# Patient Record
Sex: Female | Born: 2005 | Race: White | Hispanic: No | Marital: Single | Smoking: Never smoker
Health system: Southern US, Community
[De-identification: ages and names within clinical notes are randomized; demographics above are authoritative.]

## PROBLEM LIST (undated history)

## (undated) ENCOUNTER — Emergency Department (HOSPITAL_BASED_OUTPATIENT_CLINIC_OR_DEPARTMENT_OTHER): Admission: EM | Discharge status: Absent without leave | Payer: Medicaid Other

## (undated) DIAGNOSIS — N83209 Unspecified ovarian cyst, unspecified side: Secondary | ICD-10-CM

## (undated) DIAGNOSIS — Z820 Family history of epilepsy and other diseases of the nervous system: Secondary | ICD-10-CM

## (undated) DIAGNOSIS — D492 Neoplasm of unspecified behavior of bone, soft tissue, and skin: Secondary | ICD-10-CM

## (undated) DIAGNOSIS — N200 Calculus of kidney: Secondary | ICD-10-CM

## (undated) DIAGNOSIS — Z87898 Personal history of other specified conditions: Secondary | ICD-10-CM

## (undated) DIAGNOSIS — M6740 Ganglion, unspecified site: Secondary | ICD-10-CM

## (undated) DIAGNOSIS — Z8669 Personal history of other diseases of the nervous system and sense organs: Secondary | ICD-10-CM

## (undated) HISTORY — DX: Personal history of other diseases of the nervous system and sense organs: Z86.69

## (undated) HISTORY — DX: Family history of epilepsy and other diseases of the nervous system: Z82.0

---
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
## 2023-12-18 ENCOUNTER — Emergency Department (HOSPITAL_BASED_OUTPATIENT_CLINIC_OR_DEPARTMENT_OTHER)

## 2023-12-18 ENCOUNTER — Emergency Department (HOSPITAL_BASED_OUTPATIENT_CLINIC_OR_DEPARTMENT_OTHER)
Admission: EM | Admit: 2023-12-18 | Discharge: 2023-12-18 | Disposition: A | Discharge status: Home / Self Care | Attending: Emergency Medicine | Admitting: Emergency Medicine

## 2023-12-18 ENCOUNTER — Encounter (HOSPITAL_BASED_OUTPATIENT_CLINIC_OR_DEPARTMENT_OTHER): Payer: Self-pay | Admitting: Emergency Medicine

## 2023-12-18 ENCOUNTER — Other Ambulatory Visit: Payer: Self-pay

## 2023-12-18 DIAGNOSIS — G43909 Migraine, unspecified, not intractable, without status migrainosus: Secondary | ICD-10-CM | POA: Insufficient documentation

## 2023-12-18 DIAGNOSIS — R519 Headache, unspecified: Secondary | ICD-10-CM | POA: Diagnosis present

## 2023-12-18 DIAGNOSIS — G43109 Migraine with aura, not intractable, without status migrainosus: Secondary | ICD-10-CM

## 2023-12-18 HISTORY — DX: Unspecified ovarian cyst, unspecified side: N83.209

## 2023-12-18 HISTORY — DX: Ganglion, unspecified site: M67.40

## 2023-12-18 LAB — CBC WITH DIFFERENTIAL/PLATELET
Abs Immature Granulocytes: 0.03 K/uL (ref 0.00–0.07)
Basophils Absolute: 0 K/uL (ref 0.0–0.1)
Basophils Relative: 1 %
Eosinophils Absolute: 0.1 K/uL (ref 0.0–0.5)
Eosinophils Relative: 1 %
HCT: 38.8 % (ref 36.0–46.0)
Hemoglobin: 13.2 g/dL (ref 12.0–15.0)
Immature Granulocytes: 0 %
Lymphocytes Relative: 21 %
Lymphs Abs: 1.8 K/uL (ref 0.7–4.0)
MCH: 29.5 pg (ref 26.0–34.0)
MCHC: 34 g/dL (ref 30.0–36.0)
MCV: 86.8 fL (ref 80.0–100.0)
Monocytes Absolute: 0.6 K/uL (ref 0.1–1.0)
Monocytes Relative: 7 %
Neutro Abs: 6 K/uL (ref 1.7–7.7)
Neutrophils Relative %: 70 %
Platelets: 319 K/uL (ref 150–400)
RBC: 4.47 MIL/uL (ref 3.87–5.11)
RDW: 11.9 % (ref 11.5–15.5)
WBC: 8.5 K/uL (ref 4.0–10.5)
nRBC: 0 % (ref 0.0–0.2)

## 2023-12-18 LAB — COMPREHENSIVE METABOLIC PANEL WITH GFR
ALT: 6 U/L (ref 0–44)
AST: 16 U/L (ref 15–41)
Albumin: 4.6 g/dL (ref 3.5–5.0)
Alkaline Phosphatase: 69 U/L (ref 38–126)
Anion gap: 13 (ref 5–15)
BUN: 15 mg/dL (ref 6–20)
CO2: 24 mmol/L (ref 22–32)
Calcium: 9.6 mg/dL (ref 8.9–10.3)
Chloride: 103 mmol/L (ref 98–111)
Creatinine, Ser: 0.66 mg/dL (ref 0.44–1.00)
GFR, Estimated: >60 mL/min (ref >60)
Glucose, Bld: 87 mg/dL (ref 70–99)
Potassium: 3.4 mmol/L — ABNORMAL LOW (ref 3.5–5.1)
Sodium: 141 mmol/L (ref 135–145)
Total Bilirubin: 0.9 mg/dL (ref 0.0–1.2)
Total Protein: 7.1 g/dL (ref 6.5–8.1)

## 2023-12-18 LAB — PREGNANCY, URINE: Preg Test, Ur: NEGATIVE

## 2023-12-18 MED ORDER — DEXAMETHASONE SODIUM PHOSPHATE 10 MG/ML IJ SOLN
10.0000 mg | Freq: Once | INTRAMUSCULAR | Status: AC
  Administered 2023-12-18: 10 mg via INTRAVENOUS
  Filled 2023-12-18: qty 1

## 2023-12-18 MED ORDER — METOCLOPRAMIDE HCL 5 MG/ML IJ SOLN
10.0000 mg | Freq: Once | INTRAMUSCULAR | Status: AC
  Administered 2023-12-18: 10 mg via INTRAVENOUS
  Filled 2023-12-18: qty 2

## 2023-12-18 MED ORDER — DIPHENHYDRAMINE HCL 50 MG/ML IJ SOLN
12.5000 mg | Freq: Once | INTRAMUSCULAR | Status: AC
  Administered 2023-12-18: 12.5 mg via INTRAVENOUS
  Filled 2023-12-18: qty 1

## 2023-12-18 MED ORDER — KETOROLAC TROMETHAMINE 15 MG/ML IJ SOLN
15.0000 mg | Freq: Once | INTRAMUSCULAR | Status: AC
  Administered 2023-12-18: 15 mg via INTRAVENOUS
  Filled 2023-12-18: qty 1

## 2023-12-18 MED ORDER — METOCLOPRAMIDE HCL 10 MG PO TABS: 10.0000 mg | ORAL_TABLET | Freq: Four times a day (QID) | ORAL | 0 refills | Status: AC

## 2023-12-18 NOTE — ED Triage Notes (Signed)
 Pt states woke up at 3 am, felt weird feeling in head, laid back down, about 6:30 on the way to work, came back with a migraine and  blurred vision. States migraine is gone but still has slight headache and weird feeling. Describes it as physical not mental and like disassociated. States period started today.

## 2023-12-18 NOTE — ED Provider Notes (Signed)
 [facility] Provider Note   CSN: [id number] Arrival date & time: 12/18/23  1614     Patient presents with: Blurred Vision   Kristen Pope is a 18 y.o. female.   18 year old female with a history of migraines presents emergency department headache and vision changes.  Patient reports that since she was approximately 67 or 10 she has been having complex migraines and typically will have vision changes out of her right eye and numbness on her left side of her body that accompany her headaches.  Says that last night she woke up at 3 AM with a strange feeling in her head that she has difficulty characterizing.  Since then has developed a headache that is 8/10 in severity.  Mostly along the middle of her head and radiating down to her right occiput.  No photo or phonophobia.  Did have transient blurry vision out of her right eye but no numbness that is characteristic of these headaches for her.  Has not yet taken any medications for her headache.       Prior to Admission medications   Medication Sig Start Date End Date Taking? Authorizing Provider  metoCLOPramide  (REGLAN ) 10 MG tablet Take 1 tablet (10 mg total) by mouth every 6 (six) hours. 12/18/23  Yes Yolande Lamar BROCKS, MD  ergocalciferol (VITAMIN D2) 1.25 MG (50000 UT) capsule Take by mouth. 05/10/18   [provider]  ESTARYLLA 0.25-35 MG-MCG tablet Take 1 tablet by mouth daily. 08/09/19   [provider]  fluconazole  (DIFLUCAN ) 150 MG tablet Take 1 tablet (150 mg total) by mouth daily. 01/21/22   Randol Simmonds, MD  fluticasone (FLONASE) 50 MCG/ACT nasal spray SMARTSIG:1 Spray(s) Both Nares Daily PRN 02/13/19   [provider]  HYDROcodone -acetaminophen  (NORCO) 5-325 MG tablet Take 1 tablet by mouth every 4 (four) hours as needed for severe pain. 01/21/22   Randol Simmonds, MD  naproxen  (NAPROSYN ) 375 MG tablet Take 1 tablet (375 mg total) by mouth 2 (two) times daily. 01/21/22    Randol Simmonds, MD  norelgestromin-ethinyl estradiol (ZAFEMY) 150-35 MCG/24HR transdermal patch Place 1 patch onto the skin once a week.    [provider]  Norethindrone Acetate-Ethinyl Estrad-FE (LOESTRIN 24 FE) 1-20 MG-MCG(24) tablet Take by mouth. 07/17/19   [provider]  ondansetron  (ZOFRAN ) 4 MG tablet Take 1 tablet (4 mg total) by mouth every 8 (eight) hours as needed for nausea or vomiting. 08/16/21   Horton, Kristie M, DO  sertraline (ZOLOFT) 25 MG tablet 50 mg. 08/25/19   [provider]  tamsulosin  (FLOMAX ) 0.4 MG CAPS capsule Take 1 capsule (0.4 mg total) by mouth daily. 08/16/21   Horton, Kristie M, DO  VITAMIN D, CHOLECALCIFEROL, PO Take by mouth.    [provider]    Allergies: Amoxicillin-pot clavulanate and Doxycycline    Review of Systems  Updated Vital Signs BP 126/83 (BP Location: Right Arm)   Pulse 79   Temp 99 F (37.2 C) (Oral)   Resp 16   Ht 5' (1.524 m)   Wt 51 kg   LMP 12/18/2023   SpO2 99%   BMI 21.95 kg/m   Physical Exam Vitals and nursing note reviewed.  Constitutional:      General: She is not in acute distress.    Appearance: She is well-developed.  HENT:     Head: Normocephalic and atraumatic.     Comments: No rashes over scalp or neck    Right Ear:  External ear normal.     Left Ear: External ear normal.     Nose: Nose normal.  Eyes:     Extraocular Movements: Extraocular movements intact.     Conjunctiva/sclera: Conjunctivae normal.     Pupils: Pupils are equal, round, and reactive to light.     Comments: Visual Acuity Bilateral Distance: 20/32R Distance: 20/50L Distance: 20/25   Musculoskeletal:     Cervical back: Normal range of motion and neck supple.  Skin:    General: Skin is warm and dry.  Neurological:     Mental Status: She is alert.     Comments: MENTAL STATUS: AAOx3 CRANIAL NERVES: II: Pupils equal and reactive 5 mm BL, no RAPD, no VF deficits III, IV, VI: EOM intact, no gaze preference or  deviation, no nystagmus. V: normal sensation to light touch in V1, V2, and V3 segments bilaterally VII: no facial weakness or asymmetry, no nasolabial fold flattening VIII: normal hearing to speech and finger friction IX, X: normal palatal elevation, no uvular deviation XI: 5/5 head turn and 5/5 shoulder shrug bilaterally XII: midline tongue protrusion MOTOR: 5/5 strength in R shoulder flexion, elbow flexion and extension, and grip strength. 5/5 strength in L shoulder flexion, elbow flexion and extension, and grip strength.  5/5 strength in R hip and knee flexion, knee extension, ankle plantar and dorsiflexion. 5/5 strength in L hip and knee flexion, knee extension, ankle plantar and dorsiflexion. SENSORY: Normal sensation to light touch in all extremities COORD: Normal finger to nose and heel to shin, no tremor, no dysmetria STATION: No truncal ataxia   Psychiatric:        Mood and Affect: Mood normal.     (all labs ordered are listed, but only abnormal results are displayed) Labs Reviewed  COMPREHENSIVE METABOLIC PANEL WITH GFR - Abnormal; Notable for the following components:      Result Value   Potassium 3.4 (*)    All other components within normal limits  CBC WITH DIFFERENTIAL/PLATELET  PREGNANCY, URINE    EKG: None  Radiology: CT Head Wo Contrast Result Date: 12/18/2023 CLINICAL DATA:  Headache and vision changes. EXAM: CT HEAD WITHOUT CONTRAST TECHNIQUE: Contiguous axial images were obtained from the base of the skull through the vertex without intravenous contrast. RADIATION DOSE REDUCTION: This exam was performed according to the departmental dose-optimization program which includes automated exposure control, adjustment of the mA and/or kV according to patient size and/or use of iterative reconstruction technique. COMPARISON:  Head CT 11/25/2009 FINDINGS: Brain: There is no evidence of an acute infarct, intracranial hemorrhage, mass, midline shift, or extra-axial fluid  collection. Cerebral volume is normal. The ventricles are normal in size. Vascular: No hyperdense vessel. Skull: No fracture or suspicious lesion. Sinuses/Orbits: Suspected small mucous retention cyst in the right sphenoid sinus. Clear mastoid air cells. Unremarkable orbits. Other: None. IMPRESSION: Unremarkable CT appearance of the brain. Electronically Signed   By: Dasie Hamburg M.D.   On: 12/18/2023 18:07     Procedures   Medications Ordered in the ED  ketorolac  (TORADOL ) 15 MG/ML injection 15 mg (15 mg Intravenous Given 12/18/23 1723)  metoCLOPramide  (REGLAN ) injection 10 mg (10 mg Intravenous Given 12/18/23 1727)  diphenhydrAMINE  (BENADRYL ) injection 12.5 mg (12.5 mg Intravenous Given 12/18/23 1720)  dexamethasone  (DECADRON ) injection 10 mg (10 mg Intravenous Given 12/18/23 1725)  Medical Decision Making Amount and/or Complexity of Data Reviewed Labs: ordered. Radiology: ordered.  Risk Prescription drug management.   18 year old female with history of migraines who presents emergency department headache and vision changes  Initial Ddx:  Complicated migraine, headache, meningitis, SAH, TIA, stroke, MS  MDM/Course:  Patient presents to the emergency department with a headache and vision changes.  Says that she often times will have vision changes but typically has numbness of her arm which is absent this time.  Headache appears to be consistent with her history of complicated migraines though she has not been formally diagnosed with this yet.  Does not have any focal neurologic deficits at this point in time.  Visual acuity appears to be grossly normal.  She had a head CT that does not show acute findings.  Was given a migraine cocktail and upon re-evaluation her headache was only 1/10 in severity.  Given her age and risk factors feel today and stroke are less likely.  Given the recurrent headaches that she is having with the neurologic deficits do feel  like she would benefit from neurology evaluation though.  Placed referral to outpatient neurology and instructed also follow-up with her primary doctor in several days as well.  This patient presents to the ED for concern of complaints listed in HPI, this involves an extensive number of treatment options, and is a complaint that carries with it a high risk of complications and morbidity. Disposition including potential need for admission considered.   Dispo: DC Home. Return precautions discussed including, but not limited to, those listed in the AVS. Allowed pt time to ask questions which were answered fully prior to dc.  Additional history obtained from mother Records reviewed Outpatient Clinic Notes The following labs were independently interpreted: Chemistry and show no acute abnormality I independently reviewed the following imaging with scope of interpretation limited to determining acute life threatening conditions related to emergency care: CT Head and agree with the radiologist interpretation with the following exceptions: none I personally reviewed and interpreted cardiac monitoring: normal sinus rhythm  I personally reviewed and interpreted the pt's EKG: see above for interpretation  I have reviewed the patients home medications and made adjustments as needed  Portions of this note were generated with Dragon dictation software. Dictation errors may occur despite best attempts at proofreading.     Final diagnoses:  Complicated migraine    ED Discharge Orders          Ordered    metoCLOPramide  (REGLAN ) 10 MG tablet  Every 6 hours        12/18/23 1817    Ambulatory referral to Neurology       Comments: An appointment is requested in approximately: 2 weeks   12/18/23 1818               Yolande Lamar BROCKS, MD 12/18/23 2003

## 2023-12-18 NOTE — Discharge Instructions (Signed)
 You were seen for your headache in the emergency department.  You were given medicines which improved your symptoms.  At home, please take Tylenol  and ibuprofen  for your headache.  You may also take the Reglan  we have prescribed you for your headache or any nausea or vomiting that you have.  You may take this with Benadryl  for severe headaches but please note that this will make you drowsy.    Check your MyChart online for the results of any tests that had not resulted by the time you left the emergency department.   Follow-up with your primary doctor in 2-3 days regarding your visit.  Neurology will call you about an appointment.  If you do not hear from them in the next 2-3 business days please give them a call using this information.  Return immediately to the emergency department if you experience any of the following: Vision changes, numbness or weakness of your arms or legs, or any other concerning symptoms.    Thank you for visiting our Emergency Department. It was a pleasure taking care of you today.

## 2023-12-20 ENCOUNTER — Encounter: Payer: Self-pay | Admitting: Neurology

## 2023-12-24 ENCOUNTER — Ambulatory Visit (INDEPENDENT_AMBULATORY_CARE_PROVIDER_SITE_OTHER): Admitting: Neurology

## 2023-12-24 ENCOUNTER — Encounter: Payer: Self-pay | Admitting: Neurology

## 2023-12-24 VITALS — BP 118/79 | HR 92 | Ht 60.0 in | Wt 110.0 lb

## 2023-12-24 DIAGNOSIS — G43109 Migraine with aura, not intractable, without status migrainosus: Secondary | ICD-10-CM | POA: Diagnosis not present

## 2023-12-24 MED ORDER — ONDANSETRON HCL 4 MG PO TABS: 4.0000 mg | ORAL_TABLET | Freq: Three times a day (TID) | ORAL | 5 refills | Status: DC | PRN

## 2023-12-24 NOTE — Patient Instructions (Signed)
  Take Ubrelvy at earliest onset of headache.  May repeat dose once in 2 hours if needed.  Maximum 2 tablets in 24 hours. Zofran  for nausea Limit use of pain relievers to no more than 9 days out of the month.  These medications include acetaminophen , NSAIDs (ibuprofen /Advil /Motrin , naproxen /Aleve , triptans (Imitrex/sumatriptan), Excedrin, and narcotics.  This will help reduce risk of rebound headaches. Be aware of common food triggers Routine exercise Stay adequately hydrated (aim for 64 oz water daily) Keep headache diary Maintain proper stress management Maintain proper sleep hygiene Do not skip meals Consider supplements:  MigreLief twice daily, coenzyme Q10 100mg  three times daily.

## 2023-12-24 NOTE — Progress Notes (Signed)
 NEUROLOGY CONSULTATION NOTE  [name] MRN: [id number] DOB: Apr 28, 2006  Referring provider: Lamar Shan, MD (ED referral) Primary care provider: [facility]  Reason for consult:  migraine  Assessment/Plan:   Migraine with aura, without status migrainosus, not intractable  At this time, I don't think further testing, such as MRI, is warranted.  Headache was consistent with her habitual migraines except it didn't include the unilateral numbness/paresthesias, exam is unremarkable and CT head unremarkable.   Migraine prevention:  At this time, will hold off on starting a preventative medication.  If she begins having increased frequency, we can consider starting a preventative medication.  In the meantime, we talked about lifestyle modification and vitamins/supplements options (magnesium, B2, CoQ10) Migraine rescue:  Provided samples of Ubrelvy 100mg .  Cannot take triptans as she has stroke-like symptoms with her migraines Limit use of pain relievers to no more than 9 days out of the month to prevent risk of rebound or medication-overuse headache. Keep headache diary Advised not to take estrogen-based birth control medication as she has aura Follow up 8 months.    Subjective:  Kristen Pope is a 18 year old right-handed female with history of isolated post-traumatic seizure and kidney stone who presents for migraine.  History supplemented by ED note.  CT head personally reviewed.  Onset:  18 or 18 years old.   Location:  right sided radiating to back of the head (sometimes left sided) Quality:  stabbing Intensity:  9/10.  Aura:  white out of half visual field involving eye on side of headache (usually preceding headache), contralateral hemisensory paresthesias (face, limbs, torso) usually preceding migraine.  Aura lasts 1 hour Prodrome:  dissociated, brain fog Postdrome:  feels fatigued and disassociated for a couple of days Associated  symptoms:  Photophobia, phonophobia.  Rarely nausea..  She denies associated vomiting, osmophobia, autonomic symptoms or associated unilateral weakness Duration:  headache lasts 2-3 hours Frequency:  once every month to every other month Triggers:  strobe lights, flashing arcade lights, fireworks Relieving factors:  ibuprofen , rest in dark and cool room Activity:  aggravates Resolved around age 18 or 18 years 18 years old.  She had her first migraine since age 20-20 on 8/16 on 8/16 which was different.  She believes it was triggered by watching fireworks the previous night.  She woke up at 3 AM and felt weird in her head and then later that day developed an 8/10 headache While she did have transient blurred vision in her right eye, this time she didn't experience the left sided numbness.  She went to the ED.  CT head was unremarkable and she was treated with migraine cocktail.  Headache lasted about 2 hours.  For the past 2 days she has had a persistent dull 4/10 pressure headache from the mid-frontal region radiating to the right side but no associated symptoms but it appears to be improving.  Past NSAIDS/analgesics:  naproxen , Toradol  IV Past abortive triptans:  none Past abortive ergotamine:  none Past muscle relaxants:  none Past anti-emetic:  none Past antihypertensive medications:  none Past antidepressant medications:  none Past anticonvulsant medications:  none Past anti-CGRP:  none Past vitamins/Herbal/Supplements:  none Past antihistamines/decongestants:  Claritin, Benadryl  Other past therapies:  none  Current NSAIDS/analgesics:  ibuprofen  Current triptans:  none Current ergotamine:  none Current anti-emetic:  Zofran  4mg  Current muscle relaxants:  none Current Antihypertensive medications:  none Current Antidepressant medications:  sertraline 50mg  daily Current Anticonvulsant medications:  none Current anti-CGRP:  none  Current Vitamins/Herbal/Supplements:  none Current  Antihistamines/Decongestants:  none Other therapy:  none Birth control:  none    Caffeine:  1 cup coffee every other day.  No colas Alcohol:  rarely Smoker:  nicotine vape Diet:  Sprite.   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  Gets 7-8 hours of sleep a night.  Feels rested.   History of TBI/concussion:  She did have a provoked seizure as a child in 2011 when she tripped and fell face first on the ground.  She had a CT head on 11/25/2009 which was unremarkable.  Unsure if she had an EEG.  No recurrence Family history of headache:  mom.  No family history of seizures. 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     ESTARYLLA 0.25-35 MG-MCG tablet Take 1 tablet by mouth daily.     fluconazole  (DIFLUCAN ) 150 MG tablet Take 1 tablet (150 mg total) by mouth daily. 1 tablet 0   fluticasone (FLONASE) 50 MCG/ACT nasal spray SMARTSIG:1 Spray(s) Both Nares Daily PRN     HYDROcodone -acetaminophen  (NORCO) 5-325 MG tablet Take 1 tablet by mouth every 4 (four) hours as needed for severe pain. 15 tablet 0   metoCLOPramide   (REGLAN ) 10 MG tablet Take 1 tablet (10 mg total) by mouth every 6 (six) hours. 15 tablet 0   naproxen  (NAPROSYN ) 375 MG tablet Take 1 tablet (375 mg total) by mouth 2 (two) times daily. 20 tablet 0   norelgestromin-ethinyl estradiol (ZAFEMY) 150-35 MCG/24HR transdermal patch Place 1 patch onto the skin once a week.     Norethindrone Acetate-Ethinyl Estrad-FE (LOESTRIN 24 FE) 1-20 MG-MCG(24) tablet Take by mouth.     ondansetron  (ZOFRAN ) 4 MG tablet Take 1 tablet (4 mg total) by mouth every 8 (eight) hours as needed for nausea or vomiting. 4 tablet 0   sertraline (ZOLOFT) 25 MG tablet 50 mg.     tamsulosin  (FLOMAX ) 0.4 MG CAPS capsule Take 1 capsule (0.4 mg total) by mouth daily. 15 capsule 0   VITAMIN D, CHOLECALCIFEROL, PO Take by mouth.     No current facility-administered medications on file prior to visit.    ALLERGIES: Allergies  Allergen Reactions   Amoxicillin-Pot Clavulanate Swelling    Lips swelling - mother states she can take amoxicillin without issue   Doxycycline Swelling    FAMILY HISTORY: Family History  Problem Relation Age of Onset   Asthma Brother    Diabetes Maternal Grandmother    Hypertension Maternal Grandmother  Anesthesia problems Maternal Grandmother        post-op N/V; SOB   Diabetes Maternal Grandfather     Objective:  Blood pressure 118/79, pulse 92, height 5' (1.524 m), weight 110 lb (49.9 kg), last menstrual period 12/18/2023, SpO2 97%. General: No acute distress.  Patient appears well-groomed.   Head:  Normocephalic/atraumatic Eyes:  fundi examined but not visualized Neck: supple, no paraspinal tenderness, full range of motion Heart: regular rate and rhythm Neurological Exam: Mental status: alert and oriented to person, place, and time, speech fluent and not dysarthric, language intact. Cranial nerves: CN I: not tested CN II: pupils equal, round and reactive to light, visual fields intact CN III, IV, VI:  full range of motion, no nystagmus,  no ptosis CN V: facial sensation intact. CN VII: upper and lower face symmetric CN VIII: hearing intact CN IX, X: gag intact, uvula midline CN XI: sternocleidomastoid and trapezius muscles intact CN XII: tongue midline Bulk & Tone: normal, no fasciculations. Motor:  muscle strength 5/5 throughout Sensation:  Pinprick and vibratory sensation intact. Deep Tendon Reflexes:  2+ throughout,  toes downgoing.   Finger to nose testing:  Without dysmetria.   Gait:  Normal station and stride.  Romberg negative.    Thank you for allowing me to take part in the care of this patient.  Juliene Dunnings, DO  CC: [facility]

## 2023-12-24 NOTE — Progress Notes (Signed)
 Medication Samples have been provided to the patient.  Drug name: Holland       Strength: 100 mg        Qty: 3  LOT: [id number]  Exp.Date: 12/26  Dosing instructions: as needed  The patient has been instructed regarding the correct time, dose, and frequency of taking this medication, including desired effects and most common side effects.   Kristen Pope 9:56 AM 12/24/2023

## 2024-04-03 ENCOUNTER — Ambulatory Visit: Admitting: Neurology

## 2024-05-21 ENCOUNTER — Encounter (HOSPITAL_BASED_OUTPATIENT_CLINIC_OR_DEPARTMENT_OTHER): Payer: Self-pay

## 2024-05-21 ENCOUNTER — Emergency Department (HOSPITAL_BASED_OUTPATIENT_CLINIC_OR_DEPARTMENT_OTHER)
Admission: EM | Admit: 2024-05-21 | Discharge: 2024-05-21 | Disposition: A | Discharge status: Home / Self Care | Attending: Emergency Medicine | Admitting: Emergency Medicine

## 2024-05-21 DIAGNOSIS — Z3A09 9 weeks gestation of pregnancy: Secondary | ICD-10-CM | POA: Insufficient documentation

## 2024-05-21 DIAGNOSIS — O219 Vomiting of pregnancy, unspecified: Secondary | ICD-10-CM | POA: Insufficient documentation

## 2024-05-21 LAB — CBC WITH DIFFERENTIAL/PLATELET
Abs Immature Granulocytes: 0.07 K/uL (ref 0.00–0.07)
Basophils Absolute: 0 K/uL (ref 0.0–0.1)
Basophils Relative: 0 %
Eosinophils Absolute: 0 K/uL (ref 0.0–0.5)
Eosinophils Relative: 0 %
HCT: 42.1 % (ref 36.0–46.0)
Hemoglobin: 14.4 g/dL (ref 12.0–15.0)
Immature Granulocytes: 0 %
Lymphocytes Relative: 2 %
Lymphs Abs: 0.3 K/uL — ABNORMAL LOW (ref 0.7–4.0)
MCH: 29.6 pg (ref 26.0–34.0)
MCHC: 34.2 g/dL (ref 30.0–36.0)
MCV: 86.4 fL (ref 80.0–100.0)
Monocytes Absolute: 0.7 K/uL (ref 0.1–1.0)
Monocytes Relative: 4 %
Neutro Abs: 15.6 K/uL — ABNORMAL HIGH (ref 1.7–7.7)
Neutrophils Relative %: 94 %
Platelets: 277 K/uL (ref 150–400)
RBC: 4.87 MIL/uL (ref 3.87–5.11)
RDW: 12.5 % (ref 11.5–15.5)
WBC: 16.8 K/uL — ABNORMAL HIGH (ref 4.0–10.5)
nRBC: 0 % (ref 0.0–0.2)

## 2024-05-21 LAB — URINALYSIS, ROUTINE W REFLEX MICROSCOPIC
Bilirubin Urine: NEGATIVE
Glucose, UA: NEGATIVE mg/dL
Hgb urine dipstick: NEGATIVE
Ketones, ur: 40 mg/dL — AB
Leukocytes,Ua: NEGATIVE
Nitrite: NEGATIVE
Protein, ur: NEGATIVE mg/dL
Specific Gravity, Urine: 1.025 (ref 1.005–1.030)
pH: 6.5 (ref 5.0–8.0)

## 2024-05-21 LAB — BASIC METABOLIC PANEL WITH GFR
Anion gap: 14 (ref 5–15)
BUN: 11 mg/dL (ref 6–20)
CO2: 22 mmol/L (ref 22–32)
Calcium: 9.5 mg/dL (ref 8.9–10.3)
Chloride: 101 mmol/L (ref 98–111)
Creatinine, Ser: 0.51 mg/dL (ref 0.44–1.00)
GFR, Estimated: >60 mL/min
Glucose, Bld: 115 mg/dL — ABNORMAL HIGH (ref 70–99)
Potassium: 4.1 mmol/L (ref 3.5–5.1)
Sodium: 136 mmol/L (ref 135–145)

## 2024-05-21 MED ORDER — SODIUM CHLORIDE 0.9 % IV BOLUS
1000.0000 mL | Freq: Once | INTRAVENOUS | Status: AC
  Administered 2024-05-21: 1000 mL via INTRAVENOUS

## 2024-05-21 MED ORDER — ONDANSETRON HCL 4 MG/2ML IJ SOLN
4.0000 mg | Freq: Once | INTRAMUSCULAR | Status: AC
  Administered 2024-05-21: 4 mg via INTRAVENOUS
  Filled 2024-05-21: qty 2

## 2024-05-21 MED ORDER — METOCLOPRAMIDE HCL 5 MG/ML IJ SOLN
10.0000 mg | Freq: Once | INTRAMUSCULAR | Status: DC
  Filled 2024-05-21: qty 2

## 2024-05-21 MED ORDER — ONDANSETRON HCL 4 MG PO TABS: 4.0000 mg | ORAL_TABLET | Freq: Four times a day (QID) | ORAL | 0 refills | Status: AC

## 2024-05-21 NOTE — ED Provider Notes (Signed)
 " [facility] Provider Note   CSN: [id number] Arrival date & time: 05/21/24  1100     Patient presents with: Emesis   Kristen Pope is a 19 y.o. female.   Otherwise healthy G1 P0 who is at 9 weeks 6 days gestation who is received regular prenatal care here today for vomiting that began overnight.  She reports that she began to have small amounts of blood in her emesis.   Emesis      Prior to Admission medications  Medication Sig Start Date End Date Taking? Authorizing Provider  ondansetron  (ZOFRAN ) 4 MG tablet Take 1 tablet (4 mg total) by mouth every 6 (six) hours. 05/21/24  Yes Mannie Pac T, DO  metoCLOPramide  (REGLAN ) 10 MG tablet Take 1 tablet (10 mg total) by mouth every 6 (six) hours. 12/18/23   Yolande Lamar BROCKS, MD  sertraline (ZOLOFT) 25 MG tablet 50 mg. 08/25/19   [provider]  tretinoin (RETIN-A) 0.025 % cream at bedtime. 02/26/20   [provider]  VITAMIN D, CHOLECALCIFEROL, PO Take by mouth.    [provider]    Allergies: Amoxicillin-pot clavulanate and Doxycycline    Review of Systems  Gastrointestinal:  Positive for vomiting.    Updated Vital Signs BP 131/77   Pulse (!) 111   Temp 98 F (36.7 C)   Resp 18   LMP 03/13/2024   SpO2 100%   Physical Exam Vitals and nursing note reviewed.  Constitutional:      Appearance: She is not toxic-appearing.  Eyes:     Pupils: Pupils are equal, round, and reactive to light.  Cardiovascular:     Rate and Rhythm: Normal rate.  Pulmonary:     Effort: Pulmonary effort is normal.  Abdominal:     General: Abdomen is flat. There is no distension.     Palpations: Abdomen is soft.     Tenderness: There is no abdominal tenderness. There is no guarding.  Musculoskeletal:        General: Normal range of motion.  Skin:    General: Skin is warm.  Neurological:     General: No focal deficit present.     Mental Status: She is alert.      (all labs ordered are listed, but only abnormal results are displayed) Labs Reviewed  BASIC METABOLIC PANEL WITH GFR - Abnormal; Notable for the following components:      Result Value   Glucose, Bld 115 (*)    All other components within normal limits  CBC WITH DIFFERENTIAL/PLATELET - Abnormal; Notable for the following components:   WBC 16.8 (*)    Neutro Abs 15.6 (*)    Lymphs Abs 0.3 (*)    All other components within normal limits  URINALYSIS, ROUTINE W REFLEX MICROSCOPIC - Abnormal; Notable for the following components:   APPearance HAZY (*)    Ketones, ur 40 (*)    All other components within normal limits    EKG: None  Radiology: No results found.   Ultrasound ED OB Pelvic  Date/Time: 05/21/2024 12:50 PM  Performed by: Mannie Pac DASEN, DO Authorized by: Mannie Pac DASEN, DO   Procedure details:    Indications: evaluate for IUP     Assess:  Intrauterine pregnancy and fetal viability   Technique:  Transabdominal obstetric (HCG+) exam   Images: archived    Uterine findings:    Intrauterine pregnancy: identified     Fetal heart rate: identified  Comments:     Fetal heart rate 175    Medications Ordered in the ED  sodium chloride  0.9 % bolus 1,000 mL (1,000 mLs Intravenous New Bag/Given 05/21/24 1146)  ondansetron  (ZOFRAN ) injection 4 mg (4 mg Intravenous Given 05/21/24 1157)                                    Medical Decision Making 19 year old female here today for vomiting during first trimester pregnancy.  Differential diagnoses include morning sickness, hyperemesis gravidarum.  Plan -will provide patient with IV fluids, check labs and a urine.  Will provide some Zofran  for antiemetics.  Abdomen soft, patient overall looks quite well.  Reassessment 12:50 PM-patient's blood work overall reassuring.  She has a mild reactive leukocytosis to 16, afebrile, low suspicion for acute intra-abdominal infection.  Her symptoms have resolved with some  Zofran , she has received IV fluids.  She is now tolerating p.o.  Her urine does have some ketones, however now that she is tolerating p.o., do not believe she requires any further emergency intervention.  Urinalysis negative for infection.  Will discharge patient.  Amount and/or Complexity of Data Reviewed Labs: ordered.  Risk Prescription drug management.        Final diagnoses:  Vomiting affecting pregnancy    ED Discharge Orders          Ordered    ondansetron  (ZOFRAN ) 4 MG tablet  Every 6 hours        05/21/24 1253               Mannie Pac T, DO 05/21/24 1254  "

## 2024-05-21 NOTE — Discharge Instructions (Addendum)
 While you were in the emergency room, you had an ultrasound done that was normal.  Your blood work was normal.  I sent you a prescription for Zofran .  Make sure that you are getting plenty to eat and drink over the next few days.  Follow-up with your OB/GYN.  Return to the emergency room if you are having persistent vomiting and Zofran  is not helping.

## 2024-05-21 NOTE — ED Triage Notes (Signed)
 Pt ambulatory to triage. Reports vomiting approx 13 times since last night. Initially vomited food and then green bile Vomit with green bile and blood apptrox I hour PTA Nauseated. No diarrhea Bilateral flank pain PTA Denies urinary symptoms [redacted] weeks pregnant. Denies vaginal bleeding or discharge Recently treated for chlamydia

## 2024-08-23 ENCOUNTER — Ambulatory Visit: Admitting: Neurology
